# Patient Record
Sex: Female | Born: 1990 | Hispanic: No | Marital: Single | Smoking: Never smoker
Health system: Southern US, Community
[De-identification: ages and names within clinical notes are randomized; demographics above are authoritative.]

## PROBLEM LIST (undated history)

## (undated) ENCOUNTER — Inpatient Hospital Stay: Payer: Self-pay

## (undated) DIAGNOSIS — D649 Anemia, unspecified: Secondary | ICD-10-CM

## (undated) HISTORY — PX: WISDOM TOOTH EXTRACTION: SHX21

## (undated) HISTORY — PX: WRIST FRACTURE SURGERY: SHX121

---
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
## 2011-03-04 ENCOUNTER — Observation Stay: Payer: Self-pay

## 2011-03-04 LAB — URINALYSIS, COMPLETE
Bilirubin,UR: NEGATIVE
Glucose,UR: NEGATIVE mg/dL (ref 0–75)
Ketone: NEGATIVE
Protein: NEGATIVE
RBC,UR: 1 /HPF (ref 0–5)
Specific Gravity: 1.015 (ref 1.003–1.030)
Squamous Epithelial: 5

## 2011-03-04 LAB — FETAL FIBRONECTIN: Fetal Fibronectin: NEGATIVE

## 2011-03-05 ENCOUNTER — Ambulatory Visit: Payer: Self-pay

## 2011-04-12 ENCOUNTER — Observation Stay: Payer: Self-pay

## 2011-04-25 ENCOUNTER — Observation Stay: Payer: Self-pay

## 2011-04-27 ENCOUNTER — Observation Stay: Payer: Self-pay

## 2011-05-03 ENCOUNTER — Inpatient Hospital Stay: Payer: Self-pay | Admitting: Obstetrics and Gynecology

## 2011-05-03 LAB — CBC WITH DIFFERENTIAL/PLATELET
Basophil #: 0 10*3/uL (ref 0.0–0.1)
Basophil %: 0.4 %
Eosinophil #: 0.2 10*3/uL (ref 0.0–0.7)
HCT: 32.4 % — ABNORMAL LOW (ref 35.0–47.0)
HGB: 10.3 g/dL — ABNORMAL LOW (ref 12.0–16.0)
Lymphocyte #: 1.4 10*3/uL (ref 1.0–3.6)
Lymphocyte %: 18.8 %
MCH: 24.8 pg — ABNORMAL LOW (ref 26.0–34.0)
MCHC: 31.8 g/dL — ABNORMAL LOW (ref 32.0–36.0)
MCV: 78 fL — ABNORMAL LOW (ref 80–100)
Platelet: 164 10*3/uL (ref 150–440)
RDW: 15.7 % — ABNORMAL HIGH (ref 11.5–14.5)
WBC: 7.6 10*3/uL (ref 3.6–11.0)

## 2011-05-04 LAB — HEMATOCRIT: HCT: 29.1 % — ABNORMAL LOW (ref 35.0–47.0)

## 2011-10-23 ENCOUNTER — Emergency Department: Payer: Self-pay | Admitting: Unknown Physician Specialty

## 2012-08-07 ENCOUNTER — Emergency Department: Payer: Self-pay | Admitting: Emergency Medicine

## 2012-08-07 LAB — URINALYSIS, COMPLETE
Bilirubin,UR: NEGATIVE
Ph: 5 (ref 4.5–8.0)
Protein: NEGATIVE
Specific Gravity: 1.025 (ref 1.003–1.030)
Squamous Epithelial: 4
WBC UR: 11 /HPF (ref 0–5)

## 2012-08-07 LAB — COMPREHENSIVE METABOLIC PANEL
Albumin: 3.9 g/dL (ref 3.4–5.0)
Alkaline Phosphatase: 45 U/L — ABNORMAL LOW (ref 50–136)
Anion Gap: 3 — ABNORMAL LOW (ref 7–16)
Bilirubin,Total: 0.5 mg/dL (ref 0.2–1.0)
Calcium, Total: 8.9 mg/dL (ref 8.5–10.1)
Chloride: 107 mmol/L (ref 98–107)
Co2: 28 mmol/L (ref 21–32)
Creatinine: 0.71 mg/dL (ref 0.60–1.30)
Osmolality: 275 (ref 275–301)
Potassium: 3.4 mmol/L — ABNORMAL LOW (ref 3.5–5.1)
SGOT(AST): 17 U/L (ref 15–37)
Sodium: 138 mmol/L (ref 136–145)
Total Protein: 8 g/dL (ref 6.4–8.2)

## 2012-08-07 LAB — CBC
HCT: 36.8 % (ref 35.0–47.0)
HGB: 12.8 g/dL (ref 12.0–16.0)
MCHC: 34.9 g/dL (ref 32.0–36.0)
MCV: 87 fL (ref 80–100)
Platelet: 226 10*3/uL (ref 150–440)
RBC: 4.24 10*6/uL (ref 3.80–5.20)
RDW: 13.6 % (ref 11.5–14.5)
WBC: 7 10*3/uL (ref 3.6–11.0)

## 2012-08-07 LAB — WET PREP, GENITAL

## 2012-08-07 LAB — GC/CHLAMYDIA PROBE AMP

## 2013-12-04 ENCOUNTER — Emergency Department: Payer: Self-pay | Admitting: Emergency Medicine

## 2014-05-18 NOTE — H&P (Signed)
L&D Evaluation:  History Expanded:   HPI 24 yo G2P1001 with EDD of 04/26/11 per 14 week US. [facility] and mild anemia. Pt presents today at 32 3/7 weeks with c/o contractions. Denies SROM, VB or decreased FM.  Labs: A+, antibody neg, GC/CT neg    Blood Type A positive    Group B Strep Results (Result >5wks must be treated as unknown) unknown/result > 5 weeks ago    Maternal HIV Negative    Maternal Syphilis Ab Nonreactive    Maternal Varicella Immune    Rubella Results immune    Maternal T-Dap Unknown    Patient's Medical History No Chronic Illness    Patient's Surgical History hand surgery, wisdom teeth removal    Medications Pre Natal Vitamins  Iron    Allergies NKDA    Social History none   ROS:   ROS see HPI   Exam:   Vital Signs stable    General no apparent distress    Mental Status clear    Chest clear    Heart no murmur/gallop/rubs    Abdomen gravid, tender with contractions    Estimated Fetal Weight Average for gestational age    Back mild tenderness, would not consider + CVAT    Edema no edema    Clonus negative    Pelvic no external lesions, ft internal os, 1 cm external os/long/high, wet mount: negative    Mebranes Intact    FHT normal rate with no decels, Category I for most of tracing    Ucx regular, initially q 2-6 min on arrival    Skin dry    Other FFN negative UA: 2+ LE, 1 RBC, 1 WBC, trace bacteria, 5 epithelial   Impression:   Impression evaluate for PTL   Plan:   Comments Cervix unchanged on re-check Received dose of Procardia po without improvement Pt with syncopal episode with IV attempt, po hydration only given.  Macrobid po Betamethasone - 1st dose around 1800 today  Contractions decreased in regularity and intensity with rest and hydration - d/c home with instructions on pelvic rest, return tomorrow for 2nd BMZ, to office this week for follow-up.   Electronic  Signatures: Vella KohlerBrothers, Alexina Niccoli K (CNM)  (Signed 24-Feb-13 22:31)  Authored: L&D Evaluation   Last Updated: 24-Feb-13 22:31 by Vella KohlerBrothers, Mayana Irigoyen K (CNM)

## 2014-05-18 NOTE — H&P (Signed)
L&D Evaluation:  History:   HPI 24 yo G2P1001 with EDD of 04/26/11 per 14 week US. [facility] and mild anemia, and also preterm ctx's at 32 weeks which she rec'd tocolytics and BMTZ x2. Pt presents today at 38 weeks with c/o "gush" of fluid a few hours ago. Pt states it was clear and "mucousy" in color. Labs: A+, RI, VI, GC/CT neg, GBS Negative    Presents with leaking fluid    Patient's Medical History No Chronic Illness    Patient's Surgical History hand surgery, wisdom teeth removal    Medications Pre Natal Vitamins  Iron    Allergies NKDA    Social History none   ROS:   ROS see HPI   Exam:   Vital Signs stable    General no apparent distress    Mental Status clear    Chest clear    Abdomen gravid, non-tender, gravid, tender with contractions    Estimated Fetal Weight Average for gestational age    Edema no edema    Clonus negative    Pelvic no external lesions, 2-3 per RN exam    Mebranes Intact    FHT normal rate with no decels    Ucx irregular    Skin dry    Other Nitrazine negative, fern negative   Impression:   Impression reactive NST, 38 weeks IUP, no rupture of membranes   Plan:   Plan EFM/NST, discharge    Comments Nitrazine and fern negative, pt states did have intercourse today, informed that likely urine or semen that she felt. Pt has follow up either tomorrow or Monday, will check. DC and follow up as sched.    Follow Up Appointment already scheduled   Electronic Signatures: Shella Maximutnam, Dex Blakely (CNM)  (Signed 04-Apr-13 21:35)  Authored: L&D Evaluation   Last Updated: 04-Apr-13 21:35 by Shella MaximPutnam, Coti Burd (CNM)

## 2014-05-18 NOTE — H&P (Signed)
L&D Evaluation:  History:   HPI 24 year old G2P1 at 1841 weeks presents to L&D with c/o contractions. Was scheduled for post date incuction this evening. EDD 04/26/11, [facility] and anemia. No problems during pregnancy. Ob hx of 1 previous SVD in 2011 with no complications. Labs: A Pos, RI, VI, RPR NR, Hep B and HIV Negative,  GC/Chlam and GBS Negative    Presents with contractions    Patient's Medical History No Chronic Illness  anemia during pregnancy    Patient's Surgical History other  wisdom teeth, hand surgery    Medications Pre Natal Vitamins    Allergies NKDA    Social History none    Family History Non-Contributory   ROS:   ROS All systems were reviewed.  HEENT, CNS, GI, GU, Respiratory, CV, Renal and Musculoskeletal systems were found to be normal.   Exam:   Vital Signs stable    Urine Protein not completed    General no apparent distress, breathing through ctx's    Mental Status clear    Abdomen gravid, non-tender    Estimated Fetal Weight Average for gestational age    Back no CVAT    Edema no edema    Pelvic no external lesions, 7-8 cm on arrival, bulging membranes per RN    Mebranes Intact    FHT normal rate with no decels    Ucx regular   Impression:   Impression active labor, reactive NST, 41 weeks, active labor   Plan:   Plan EFM/NST, IV, epidural when desires, anticipate SVD   Electronic Signatures: Shella MaximPutnam, Linea Calles (CNM)  (Signed 25-Apr-13 10:43)  Authored: L&D Evaluation   Last Updated: 25-Apr-13 10:43 by Shella MaximPutnam, Doyne Micke (CNM)

## 2014-08-28 ENCOUNTER — Encounter: Payer: Self-pay | Admitting: *Deleted

## 2014-08-28 ENCOUNTER — Observation Stay
Admission: EM | Admit: 2014-08-28 | Discharge: 2014-08-28 | Disposition: A | Discharge status: Home / Self Care | Payer: Medicaid Other | Attending: Obstetrics and Gynecology | Admitting: Obstetrics and Gynecology

## 2014-08-28 DIAGNOSIS — Z3A39 39 weeks gestation of pregnancy: Secondary | ICD-10-CM | POA: Diagnosis not present

## 2014-08-28 DIAGNOSIS — Z3490 Encounter for supervision of normal pregnancy, unspecified, unspecified trimester: Secondary | ICD-10-CM

## 2014-08-28 HISTORY — DX: Anemia, unspecified: D64.9

## 2014-08-28 NOTE — H&P (Signed)
Obstetric H&P   Chief Complaint: Pressure  Prenatal Care Provider: other  History of Present Illness: 24 y.o. [id number] at [redacted]w[redacted]d by [facility] of 09/04/2014 presenting with pelvic pressure.  Irregular contraction.  No LOF, no VB.  PNC thus far uncomplicated per the patient, she is visiting her mother from out of town.   Review of Systems: 10 point review of systems negative unless otherwise noted in HPI  Past Medical History: None  Family History: No family history on file.  Social History: Social History   Social History  . Marital Status: Single    Spouse Name: N/A  . Number of Children: N/A  . Years of Education: N/A   Occupational History  . Not on file.   Social History Main Topics  . Smoking status: Not on file  . Smokeless tobacco: Not on file  . Alcohol Use: Not on file  . Drug Use: Not on file  . Sexual Activity: Not on file   Other Topics Concern  . Not on file   Social History Narrative  . No narrative on file    Medications: Prior to Admission medications   Not on File    Allergies: Allergies not on file  Physical Exam: Filed Vitals:   08/28/14 1723  Temp: 98.2 F (36.8 C)  TempSrc: Oral  Resp: 20  Height:  (1.651 m)  Weight: 55.339 kg (122 lb)    FHT: 140, moderate variability, +accels, no decels Toco: irregular q70min  General: NAD HEENT: normocephalic, anicteric Pulmonary: no increased work of breathing Abdomen: Gravid,  Non-tender Leopolds: vtx Genitourinary: 1/50/-3 Extremities: no edema  Labs: No results found for this or any previous visit (from the past 24 hour(s)).  Assessment: 24 y.o. [id number] with discomforts of pregnancy Plan: 1) R/O labor - no evidence of labor, routine labor precautions  2) Fetus - category I tracing  3) Disposition - discharge home has follow up in place  09/01/14 with her regular OB provider

## 2014-08-28 NOTE — Discharge Summary (Signed)
Reviewed discharge instructions with patient and family including signs of labor and reasons to return to ER. Both verbalized understanding. Copy of instructions given to patient.

## 2014-09-03 ENCOUNTER — Inpatient Hospital Stay
Admission: EM | Admit: 2014-09-03 | Discharge: 2014-09-03 | Disposition: A | Discharge status: Home / Self Care | Payer: Medicaid Other | Attending: Obstetrics and Gynecology | Admitting: Obstetrics and Gynecology

## 2014-09-03 DIAGNOSIS — Z3493 Encounter for supervision of normal pregnancy, unspecified, third trimester: Secondary | ICD-10-CM | POA: Diagnosis not present

## 2014-09-03 DIAGNOSIS — Z3A40 40 weeks gestation of pregnancy: Secondary | ICD-10-CM | POA: Insufficient documentation

## 2014-09-03 NOTE — Discharge Instructions (Signed)
Patient to return for regular contractions, ruptured membranes or any other concerns.

## 2014-12-14 ENCOUNTER — Emergency Department (HOSPITAL_COMMUNITY): Payer: Medicaid Other

## 2014-12-14 ENCOUNTER — Encounter (HOSPITAL_COMMUNITY): Payer: Self-pay | Admitting: *Deleted

## 2014-12-14 ENCOUNTER — Emergency Department (HOSPITAL_COMMUNITY)
Admission: EM | Admit: 2014-12-14 | Discharge: 2014-12-14 | Disposition: A | Discharge status: Home / Self Care | Payer: Medicaid Other | Attending: Emergency Medicine | Admitting: Emergency Medicine

## 2014-12-14 DIAGNOSIS — R200 Anesthesia of skin: Secondary | ICD-10-CM | POA: Insufficient documentation

## 2014-12-14 DIAGNOSIS — R51 Headache: Secondary | ICD-10-CM | POA: Insufficient documentation

## 2014-12-14 DIAGNOSIS — Z79899 Other long term (current) drug therapy: Secondary | ICD-10-CM | POA: Insufficient documentation

## 2014-12-14 DIAGNOSIS — D649 Anemia, unspecified: Secondary | ICD-10-CM | POA: Insufficient documentation

## 2014-12-14 DIAGNOSIS — R42 Dizziness and giddiness: Secondary | ICD-10-CM | POA: Insufficient documentation

## 2014-12-14 DIAGNOSIS — R110 Nausea: Secondary | ICD-10-CM | POA: Insufficient documentation

## 2014-12-14 DIAGNOSIS — R519 Headache, unspecified: Secondary | ICD-10-CM

## 2014-12-14 DIAGNOSIS — R61 Generalized hyperhidrosis: Secondary | ICD-10-CM | POA: Insufficient documentation

## 2014-12-14 DIAGNOSIS — R079 Chest pain, unspecified: Secondary | ICD-10-CM

## 2014-12-14 LAB — BASIC METABOLIC PANEL
ANION GAP: 9 (ref 5–15)
BUN: 14 mg/dL (ref 6–20)
CHLORIDE: 103 mmol/L (ref 101–111)
CO2: 27 mmol/L (ref 22–32)
Calcium: 9.1 mg/dL (ref 8.9–10.3)
Creatinine, Ser: 0.52 mg/dL (ref 0.44–1.00)
GFR calc Af Amer: >60 mL/min (ref >60)
GLUCOSE: 95 mg/dL (ref 65–99)
POTASSIUM: 3.8 mmol/L (ref 3.5–5.1)
Sodium: 139 mmol/L (ref 135–145)

## 2014-12-14 LAB — CBC
HEMATOCRIT: 35.5 % — AB (ref 36.0–46.0)
HEMOGLOBIN: 11.5 g/dL — AB (ref 12.0–15.0)
MCH: 27.5 pg (ref 26.0–34.0)
MCHC: 32.4 g/dL (ref 30.0–36.0)
MCV: 84.9 fL (ref 78.0–100.0)
Platelets: 266 10*3/uL (ref 150–400)
RBC: 4.18 MIL/uL (ref 3.87–5.11)
RDW: 16.1 % — ABNORMAL HIGH (ref 11.5–15.5)
WBC: 6.3 10*3/uL (ref 4.0–10.5)

## 2014-12-14 LAB — I-STAT TROPONIN, ED
Troponin i, poc: 0 ng/mL (ref 0.00–0.08)
Troponin i, poc: 0.01 ng/mL (ref 0.00–0.08)

## 2014-12-14 MED ORDER — FLUCONAZOLE 100 MG PO TABS
150.0000 mg | ORAL_TABLET | Freq: Once | ORAL | Status: AC
  Administered 2014-12-14: 150 mg via ORAL
  Filled 2014-12-14: qty 2

## 2014-12-14 MED ORDER — NAPROXEN 375 MG PO TABS: 375.0000 mg | ORAL_TABLET | Freq: Two times a day (BID) | ORAL | Status: AC

## 2014-12-14 MED ORDER — PROCHLORPERAZINE EDISYLATE 5 MG/ML IJ SOLN
10.0000 mg | Freq: Once | INTRAMUSCULAR | Status: DC
  Filled 2014-12-14: qty 2

## 2014-12-14 MED ORDER — KETOROLAC TROMETHAMINE 30 MG/ML IJ SOLN
30.0000 mg | Freq: Once | INTRAMUSCULAR | Status: AC
  Administered 2014-12-14: 30 mg via INTRAVENOUS
  Filled 2014-12-14: qty 1

## 2014-12-14 MED ORDER — DIPHENHYDRAMINE HCL 50 MG/ML IJ SOLN
25.0000 mg | Freq: Once | INTRAMUSCULAR | Status: DC
  Filled 2014-12-14: qty 1

## 2014-12-14 MED ORDER — DEXAMETHASONE SODIUM PHOSPHATE 10 MG/ML IJ SOLN
10.0000 mg | Freq: Once | INTRAMUSCULAR | Status: AC
  Administered 2014-12-14: 10 mg via INTRAVENOUS
  Filled 2014-12-14: qty 1

## 2014-12-14 MED ORDER — SODIUM CHLORIDE 0.9 % IV BOLUS (SEPSIS)
1000.0000 mL | Freq: Once | INTRAVENOUS | Status: AC
  Administered 2014-12-14: 1000 mL via INTRAVENOUS

## 2014-12-14 NOTE — ED Notes (Signed)
Pt reports Migraine onset today about an hr ago, pt reports L sided CP onset x 4 days ago, pt C/O SOB, pt c/o nausea, denies v/d, pt c/o blurred vision & sensitivity, A&O x4, no slurred speech noted, pt ambulatory

## 2014-12-14 NOTE — ED Notes (Signed)
PT refuses an IV at this time. It is explained to PT that IV medications are fast acting and are standard treatment for severe headaches. PT rates pain 6/10 at this time and reports she took 650mg  of tylenol at 1230 with no relief. PT confirms she does wish to refuse IV.

## 2014-12-14 NOTE — ED Provider Notes (Signed)
CSN: [id number]     Arrival date & time 12/14/14  1346 History  By signing my name below, I, Tanda Rockers, attest that this documentation has been prepared under the direction and in the presence of Haley Captain, PA-C.  Electronically Signed: Tanda Rockers, ED Scribe. 12/14/2014. 4:20 PM.   Chief Complaint  Patient presents with  . Headache   The history is provided by the patient. No language interpreter was used.     HPI Comments: Haley Carter is a 24 y.o. female who presents to the Emergency Department complaining of sudden onset, constant, left frontal headache that began at 12:30 PM today (approximately 3.5 hours ago). Pt reports that she was at work when she stood up and began feeling dizzy, nauseous, hot, and sweaty. Immediately after feeling dizzy her headache came on. Pt does describe her headache as "the worse headache of my life." She mentions having intermittent mild headaches for the past week, but none were this severe. Pt also states that the left side of her face feels numb as well. She does not have any hx of migraine headaches. Pt also complains of intermittent chest pain x 3 days, constant since her headache came on. She describes the chest pain as a tightness in her chest. No hx chest problems in the past or anxiety. Pt denies palpitations, cough, shortness of breath, vomiting, visual changes, or any other associated symptoms. Pt is non smoker. Not currently on any hormonal birth control. No hx DVT/PE. No immediate FHx of cardiac issues. She does report that her first cousin passed away from an MI at the age of 42.    Past Medical History  Diagnosis Date  . Anemia    Past Surgical History  Procedure Laterality Date  . Wrist fracture surgery Left   . Wisdom tooth extraction     No family history on file. Social History  Substance Use Topics  . Smoking status: Never Smoker   . Smokeless tobacco: None  . Alcohol Use: No   OB History    Gravida Para Term  Preterm AB TAB SAB Ectopic Multiple Living   Review of Systems  Constitutional: Positive for diaphoresis.  Eyes: Negative for visual disturbance.  Respiratory: Negative for cough and shortness of breath.   Cardiovascular: Positive for chest pain. Negative for palpitations and leg swelling.  Gastrointestinal: Positive for nausea. Negative for vomiting.  Neurological: Positive for dizziness, numbness and headaches.  All other systems reviewed and are negative.  Allergies  Review of patient's allergies indicates no known allergies.  Home Medications   Prior to Admission medications   Medication Sig Start Date End Date Taking? Authorizing Provider  Prenatal Vit-Fe Fumarate-FA (PRENATAL MULTIVITAMIN) TABS tablet Take 1 tablet by mouth daily at 12 noon.    Historical Provider, MD   Triage Vitals: BP 102/72 mmHg  Pulse 79  Temp(Src) 98.3 F (36.8 C) (Oral)  Resp 20  SpO2 98%  LMP 11/22/2014  Breastfeeding? Unknown   Physical Exam  Constitutional: She is oriented to person, place, and time. She appears well-developed and well-nourished. No distress.  HENT:  Head: Normocephalic and atraumatic.  Eyes: Conjunctivae and EOM are normal.  Neck: Neck supple. No tracheal deviation present.  Cardiovascular: Normal rate.   Pulmonary/Chest: Effort normal. No respiratory distress.  Musculoskeletal: Normal range of motion.  Neurological: She is alert and oriented to person, place, and time.  Skin: Skin is  warm and dry.  Psychiatric: She has a normal mood and affect. Her behavior is normal.  Nursing note and vitals reviewed.   ED Course  Procedures (including critical care time)  DIAGNOSTIC STUDIES: Oxygen Saturation is 98% on RA, normal by my interpretation.    COORDINATION OF CARE: 4:20 PM-Discussed treatment plan which includes CT Head with pt at bedside and pt agreed to plan.   Labs Review Labs Reviewed  CBC - Abnormal; Notable for the following:     Hemoglobin 11.5 (*)    HCT 35.5 (*)    RDW 16.1 (*)    All other components within normal limits  BASIC METABOLIC PANEL  I-STAT TROPOININ, ED    Imaging Review Dg Chest 2 View  12/14/2014  CLINICAL DATA:  Migraine headaches beginning 1 hour ago. Left-sided chest pain. Shortness breath. EXAM: CHEST - 2 VIEW COMPARISON:  None. FINDINGS: The heart size and mediastinal contours are within normal limits. Both lungs are clear. The visualized skeletal structures are unremarkable. IMPRESSION: No active disease. Electronically Signed   By: Marin Robertshristopher  Mattern M.D.   On: 12/14/2014 14:53   Ct Head Wo Contrast  12/14/2014  CLINICAL DATA:  Intense headache with light sensitivity beginning today. EXAM: CT HEAD WITHOUT CONTRAST TECHNIQUE: Contiguous axial images were obtained from the base of the skull through the vertex without intravenous contrast. COMPARISON:  None. FINDINGS: No acute cortical infarct, hemorrhage, or mass lesion is present. The ventricles are of normal size. No significant extra-axial fluid collection is evident. The paranasal sinuses and mastoid air cells are clear. The calvarium is intact. The globes and orbits are within normal limits. No focal extracranial soft tissue lesions are present. IMPRESSION: Negative CT of the head. Electronically Signed   By: Marin Robertshristopher  Mattern M.D.   On: 12/14/2014 17:58   I have personally reviewed and evaluated these images and lab results as part of my medical decision-making.   EKG Interpretation None      MDM   Final diagnoses:  Bad headache  Chest pain, unspecified chest pain type   Negative CT. Pt HA treated and improved while in ED.  Presentation is like pts typical HA and non concerning for [facility] System[facility], ICH, Meningitis, or temporal arteritis. Pt is afebrile with no focal neuro deficits, nuchal rigidity, or change in vision. Pt is to follow up with PCP to discuss prophylactic medication. Pt verbalizes understanding and is agreeable with plan to  dc.   I personally performed the services described in this documentation, which was scribed in my presence. The recorded information has been reviewed and is accurate.         Haley Captainbigail Novalyn Lajara, PA-C 12/17/14 1616  Gwyneth SproutWhitney Plunkett, MD 12/18/14 1538

## 2014-12-14 NOTE — Discharge Instructions (Signed)
You are having a headache. No specific cause was found today for your headache. It may have been a migraine or other cause of headache. Stress, anxiety, fatigue, and depression are common triggers for headaches. Your headache today does not appear to be life-threatening or require hospitalization, but often the exact cause of headaches is not determined in the emergency department. Therefore, follow-up with your doctor is very important to find out what may have caused your headache, and whether or not you need any further diagnostic testing or treatment. Sometimes headaches can appear benign (not harmful), but then more serious symptoms can develop which should prompt an immediate re-evaluation by your doctor or the emergency department. SEEK MEDICAL ATTENTION IF: You develop possible problems with medications prescribed.  The medications don't resolve your headache, if it recurs , or if you have multiple episodes of vomiting or can't take fluids. You have a change from the usual headache. RETURN IMMEDIATELY IF you develop a sudden, severe headache or confusion, become poorly responsive or faint, develop a fever above 100.71F or problem breathing, have a change in speech, vision, swallowing, or understanding, or develop new weakness, numbness, tingling, incoordination, or have a seizure.  Your caregiver has diagnosed you as having chest pain that is not specific for one problem, but does not require admission.  You are at low risk for an acute heart condition or other serious illness. Chest pain comes from many different causes.  SEEK IMMEDIATE MEDICAL ATTENTION IF: You have severe chest pain, especially if the pain is crushing or pressure-like and spreads to the arms, back, neck, or jaw, or if you have sweating, nausea (feeling sick to your stomach), or shortness of breath. THIS IS AN EMERGENCY. Don't wait to see if the pain will go away. Get medical help at once. Call 911 or 0 (operator). DO NOT drive  yourself to the hospital.  Your chest pain gets worse and does not go away with rest.  You have an attack of chest pain lasting longer than usual, despite rest and treatment with the medications your caregiver has prescribed.  You wake from sleep with chest pain or shortness of breath.  You feel dizzy or faint.  You have chest pain not typical of your usual pain for which you originally saw your caregiver.  General Headache Without Cause A headache is pain or discomfort felt around the head or neck area. The specific cause of a headache may not be found. There are many causes and types of headaches. A few common ones are:  Tension headaches.  Migraine headaches.  Cluster headaches.  Chronic daily headaches. HOME CARE INSTRUCTIONS  Watch your condition for any changes. Take these steps to help with your condition: Managing Pain  Take over-the-counter and prescription medicines only as told by your health care provider.  Lie down in a dark, quiet room when you have a headache.  If directed, apply ice to the head and neck area:  Put ice in a plastic bag.  Place a towel between your skin and the bag.  Leave the ice on for 20 minutes, 2-3 times per day.  Use a heating pad or hot shower to apply heat to the head and neck area as told by your health care provider.  Keep lights dim if bright lights bother you or make your headaches worse. Eating and Drinking  Eat meals on a regular schedule.  Limit alcohol use.  Decrease the amount of caffeine you drink, or stop drinking caffeine. General  Instructions  Keep all follow-up visits as told by your health care provider. This is important.  Keep a headache journal to help find out what may trigger your headaches. For example, write down:  What you eat and drink.  How much sleep you get.  Any change to your diet or medicines.  Try massage or other relaxation techniques.  Limit stress.  Sit up straight, and do not tense  your muscles.  Do not use tobacco products, including cigarettes, chewing tobacco, or e-cigarettes. If you need help quitting, ask your health care provider.  Exercise regularly as told by your health care provider.  Sleep on a regular schedule. Get 7-9 hours of sleep, or the amount recommended by your health care provider. SEEK MEDICAL CARE IF:   Your symptoms are not helped by medicine.  You have a headache that is different from the usual headache.  You have nausea or you vomit.  You have a fever. SEEK IMMEDIATE MEDICAL CARE IF:   Your headache becomes severe.  You have repeated vomiting.  You have a stiff neck.  You have a loss of vision.  You have problems with speech.  You have pain in the eye or ear.  You have muscular weakness or loss of muscle control.  You lose your balance or have trouble walking.  You feel faint or pass out.  You have confusion.   This information is not intended to replace advice given to you by your health care provider. Make sure you discuss any questions you have with your health care provider.   Document Released: 12/25/2004 Document Revised: 09/15/2014 Document Reviewed: 04/19/2014 Elsevier Interactive Patient Education 2016 [employer]  Nonspecific Chest Pain  Chest pain can be caused by many different conditions. There is always a chance that your pain could be related to something serious, such as a heart attack or a blood clot in your lungs. Chest pain can also be caused by conditions that are not life-threatening. If you have chest pain, it is very important to follow up with your health care provider. CAUSES  Chest pain can be caused by:  Heartburn.  Pneumonia or bronchitis.  Anxiety or stress.  Inflammation around your heart (pericarditis) or lung (pleuritis or pleurisy).  A blood clot in your lung.  A collapsed lung (pneumothorax). It can develop suddenly on its own (spontaneous pneumothorax) or from trauma to the  chest.  Shingles infection (varicella-zoster virus).  Heart attack.  Damage to the bones, muscles, and cartilage that make up your chest wall. This can include:  Bruised bones due to injury.  Strained muscles or cartilage due to frequent or repeated coughing or overwork.  Fracture to one or more ribs.  Sore cartilage due to inflammation (costochondritis). RISK FACTORS  Risk factors for chest pain may include:  Activities that increase your risk for trauma or injury to your chest.  Respiratory infections or conditions that cause frequent coughing.  Medical conditions or overeating that can cause heartburn.  Heart disease or family history of heart disease.  Conditions or health behaviors that increase your risk of developing a blood clot.  Having had chicken pox (varicella zoster). SIGNS AND SYMPTOMS Chest pain can feel like:  Burning or tingling on the surface of your chest or deep in your chest.  Crushing, pressure, aching, or squeezing pain.  Dull or sharp pain that is worse when you move, cough, or take a deep breath.  Pain that is also felt in your back, neck, shoulder,  or arm, or pain that spreads to any of these areas. Your chest pain may come and go, or it may stay constant. DIAGNOSIS Lab tests or other studies may be needed to find the cause of your pain. Your health care provider may have you take a test called an ambulatory ECG (electrocardiogram). An ECG records your heartbeat patterns at the time the test is performed. You may also have other tests, such as:  Transthoracic echocardiogram (TTE). During echocardiography, sound waves are used to create a picture of all of the heart structures and to look at how blood flows through your heart.  Transesophageal echocardiogram (TEE).This is a more advanced imaging test that obtains images from inside your body. It allows your health care provider to see your heart in finer detail.  Cardiac monitoring. This allows  your health care provider to monitor your heart rate and rhythm in real time.  Holter monitor. This is a portable device that records your heartbeat and can help to diagnose abnormal heartbeats. It allows your health care provider to track your heart activity for several days, if needed.  Stress tests. These can be done through exercise or by taking medicine that makes your heart beat more quickly.  Blood tests.  Imaging tests. TREATMENT  Your treatment depends on what is causing your chest pain. Treatment may include:  Medicines. These may include:  Acid blockers for heartburn.  Anti-inflammatory medicine.  Pain medicine for inflammatory conditions.  Antibiotic medicine, if an infection is present.  Medicines to dissolve blood clots.  Medicines to treat coronary artery disease.  Supportive care for conditions that do not require medicines. This may include:  Resting.  Applying heat or cold packs to injured areas.  Limiting activities until pain decreases. HOME CARE INSTRUCTIONS  If you were prescribed an antibiotic medicine, finish it all even if you start to feel better.  Avoid any activities that bring on chest pain.  Do not use any tobacco products, including cigarettes, chewing tobacco, or electronic cigarettes. If you need help quitting, ask your health care provider.  Do not drink alcohol.  Take medicines only as directed by your health care provider.  Keep all follow-up visits as directed by your health care provider. This is important. This includes any further testing if your chest pain does not go away.  If heartburn is the cause for your chest pain, you may be told to keep your head raised (elevated) while sleeping. This reduces the chance that acid will go from your stomach into your esophagus.  Make lifestyle changes as directed by your health care provider. These may include:  Getting regular exercise. Ask your health care provider to suggest some  activities that are safe for you.  Eating a heart-healthy diet. A registered dietitian can help you to learn healthy eating options.  Maintaining a healthy weight.  Managing diabetes, if necessary.  Reducing stress. SEEK MEDICAL CARE IF:  Your chest pain does not go away after treatment.  You have a rash with blisters on your chest.  You have a fever. SEEK IMMEDIATE MEDICAL CARE IF:   Your chest pain is worse.  You have an increasing cough, or you cough up blood.  You have severe abdominal pain.  You have severe weakness.  You faint.  You have chills.  You have sudden, unexplained chest discomfort.  You have sudden, unexplained discomfort in your arms, back, neck, or jaw.  You have shortness of breath at any time.  You suddenly start  to sweat, or your skin gets clammy.  You feel nauseous or you vomit.  You suddenly feel light-headed or dizzy.  Your heart begins to beat quickly, or it feels like it is skipping beats. These symptoms may represent a serious problem that is an emergency. Do not wait to see if the symptoms will go away. Get medical help right away. Call your local emergency services (911 in the [place] Do not drive yourself to the hospital.   This information is not intended to replace advice given to you by your health care provider. Make sure you discuss any questions you have with your health care provider.   Document Released: 10/04/2004 Document Revised: 01/15/2014 Document Reviewed: 07/31/2013 Elsevier Interactive Patient Education [employer].

## 2018-01-31 ENCOUNTER — Other Ambulatory Visit: Payer: Self-pay | Admitting: Nurse Practitioner

## 2018-01-31 DIAGNOSIS — S39012D Strain of muscle, fascia and tendon of lower back, subsequent encounter: Secondary | ICD-10-CM

## 2018-02-11 ENCOUNTER — Ambulatory Visit
Admission: RE | Admit: 2018-02-11 | Discharge: 2018-02-11 | Disposition: A | Discharge status: Home / Self Care | Payer: Worker's Compensation | Source: Ambulatory Visit | Attending: Nurse Practitioner | Admitting: Nurse Practitioner

## 2018-02-11 DIAGNOSIS — S39012D Strain of muscle, fascia and tendon of lower back, subsequent encounter: Secondary | ICD-10-CM

## 2020-12-03 IMAGING — MR MR LUMBAR SPINE W/O CM
4 of 5 series · 19 of 48 positions shown · non-contrast
Comparison: None available.

CLINICAL DATA: Initial evaluation for low back pain with left lower
extremity/posterior thigh pain.

EXAM:
MRI LUMBAR SPINE WITHOUT CONTRAST
TECHNIQUE: Multiplanar, multisequence MR imaging of the lumbar spine was
performed. No intravenous contrast was administered.

[Series 5: T2 · sagittal · 4.0mm · 0.68mm/px · 6 of 12 slices shown (1 of 2)]
[im 1/12]
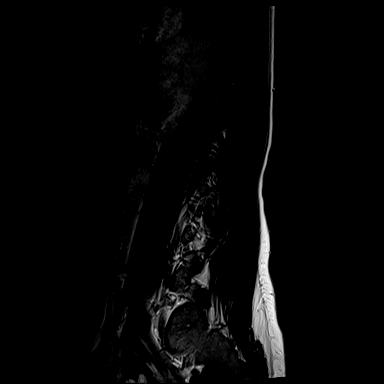
[im 3/12]
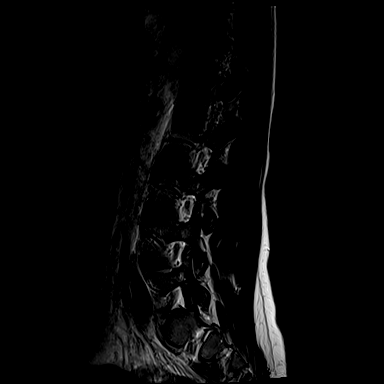
[im 5/12]
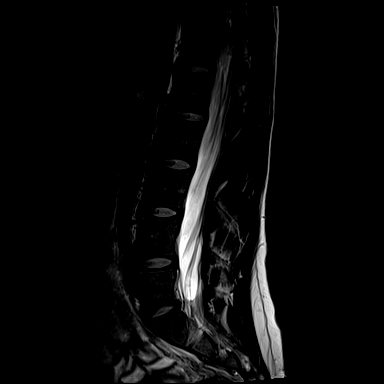
[im 7/12]
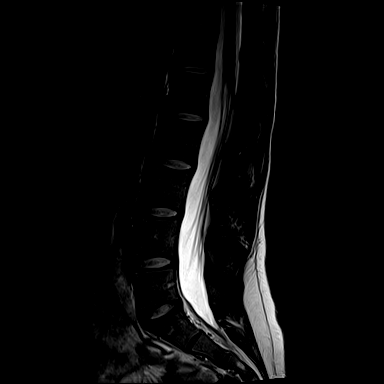
[im 9/12]
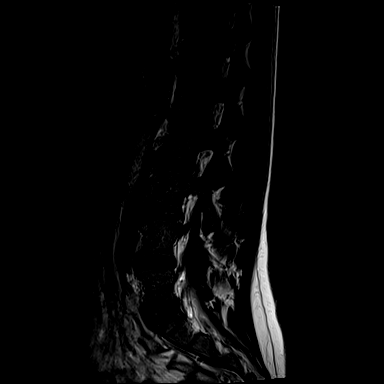
[im 12/12]
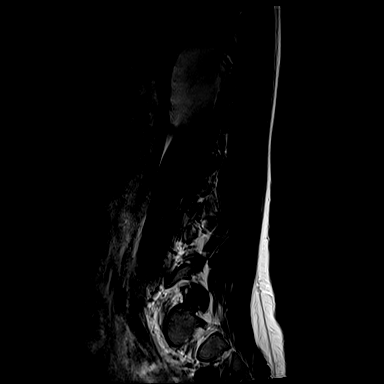

[Series 6: T1 · sagittal · 4.0mm · 0.81mm/px · 3 of 12 slices shown (1 of 2)]
[im 1/12]
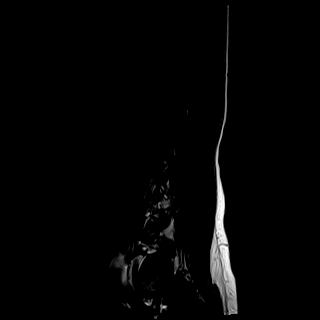
[im 6/12]
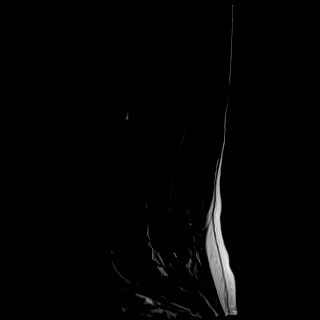
[im 12/12]
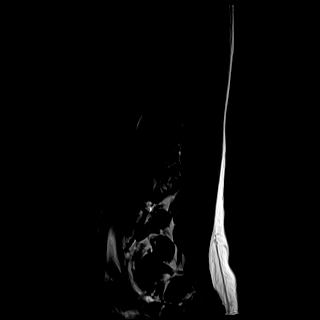

[Series 10: T1 · axial · 4.0mm · 0.28mm/px · z∈[-81,+77]mm · 3 of 36 slices shown (2 of 2)]
[im 5/36]
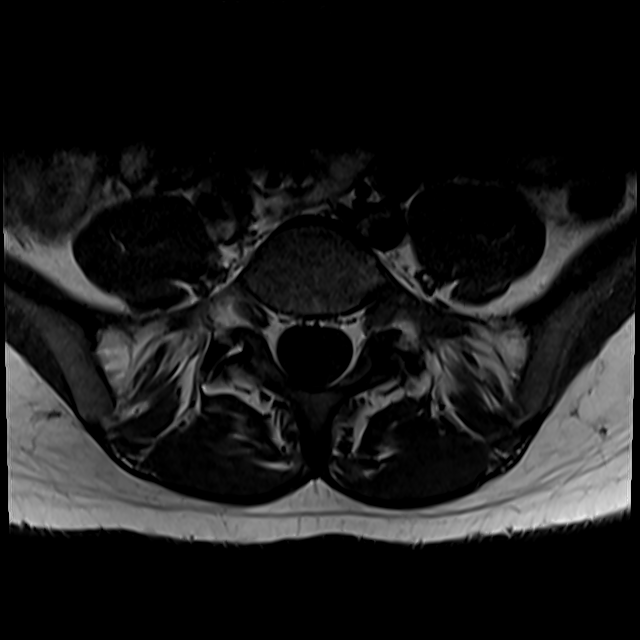
[im 19/36]
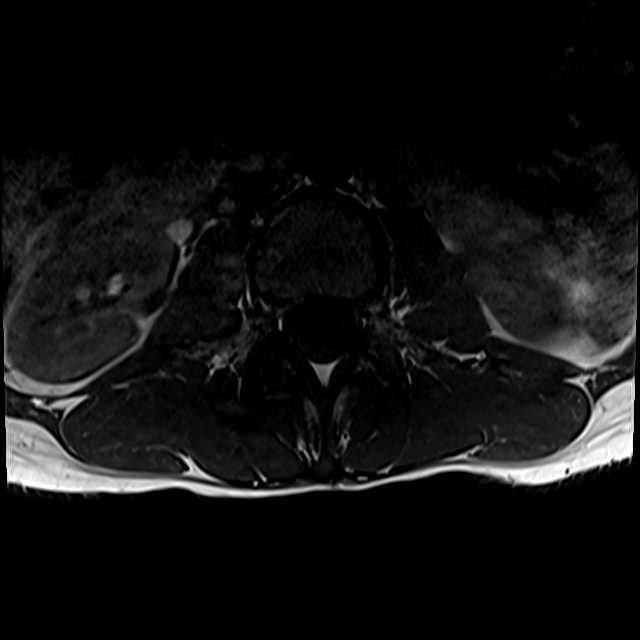
[im 31/36]
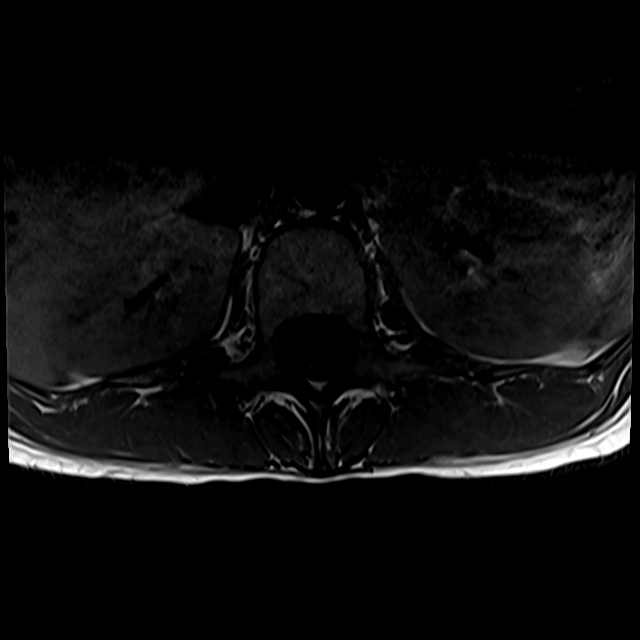

[Series 13: T2 · axial · 4.0mm · 0.28mm/px · z∈[-91,+77]mm · 7 of 36 slices shown (2 of 2)]
[im 3/36]
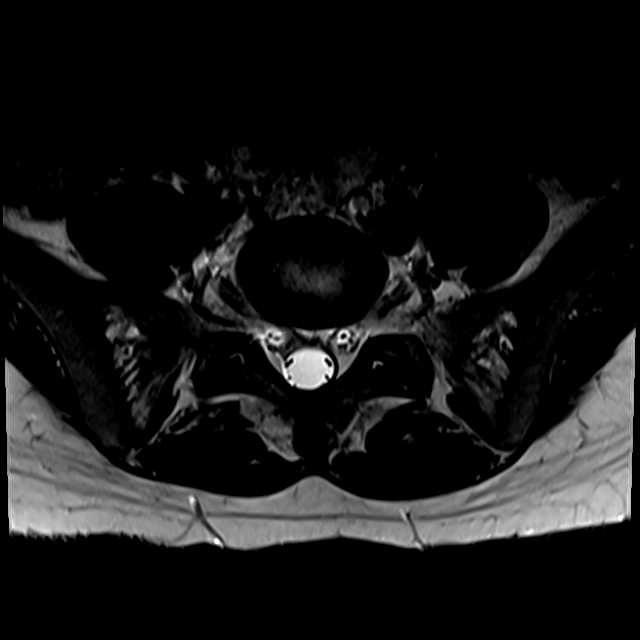
[im 5/36]
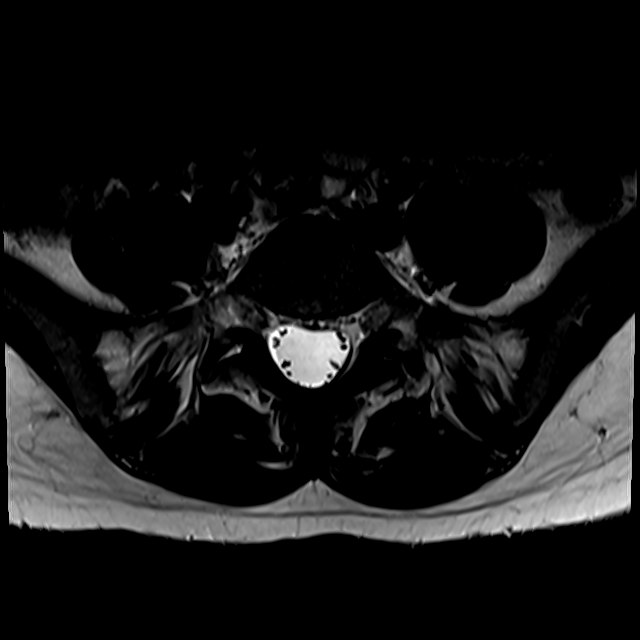
[im 8/36]
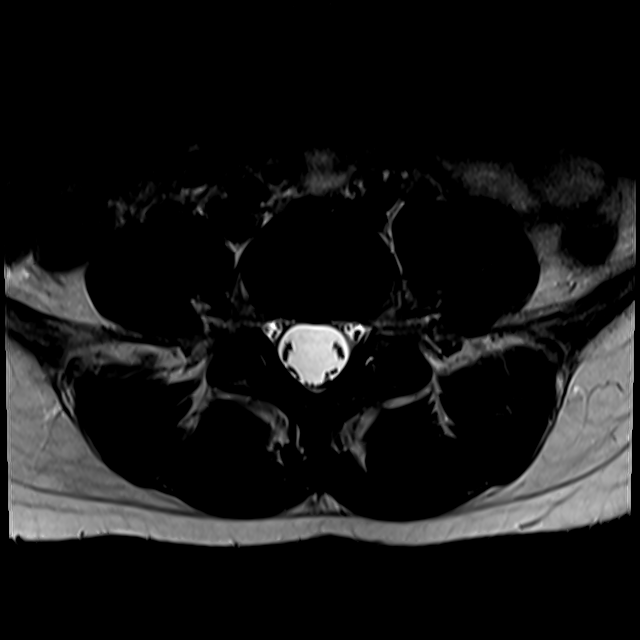
[im 12/36]
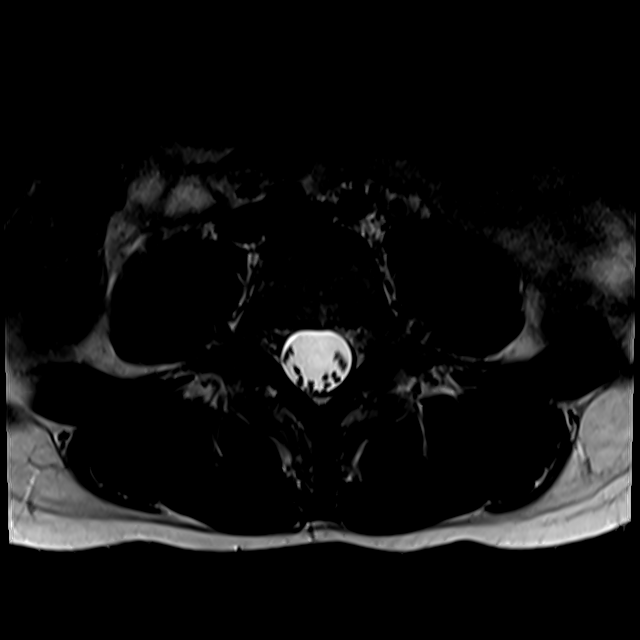
[im 17/36]
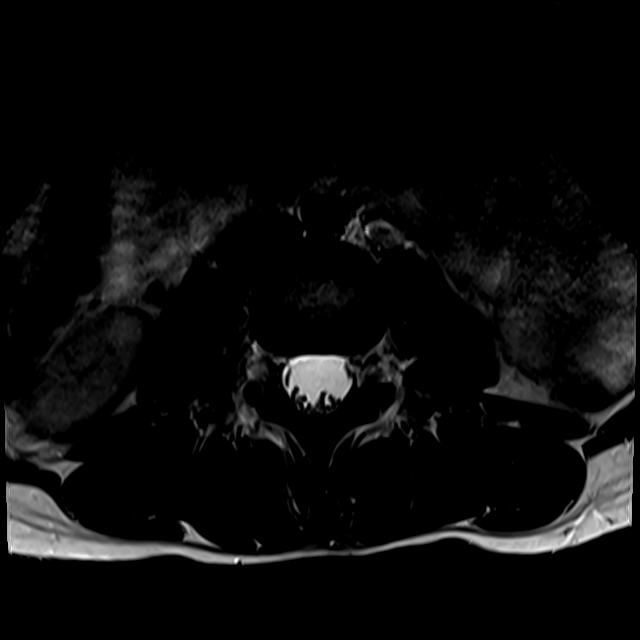
[im 19/36]
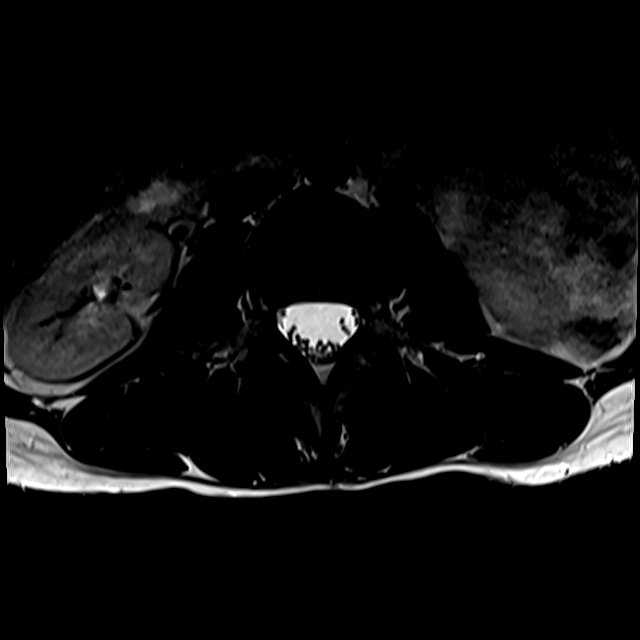
[im 31/36]
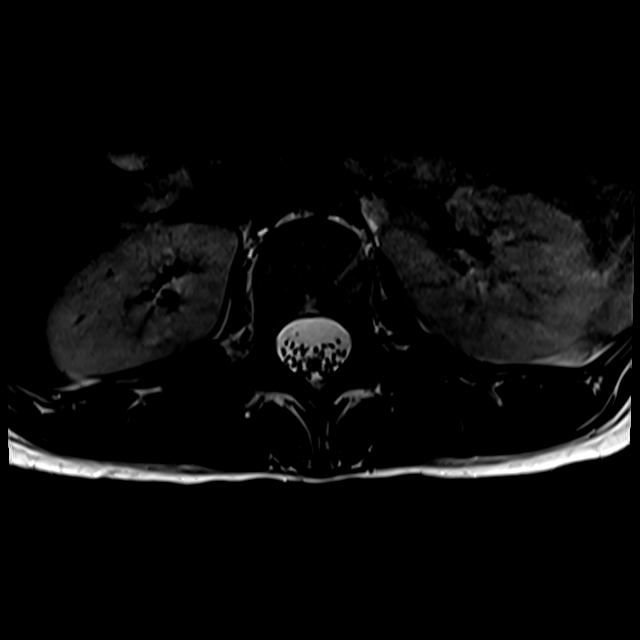

[19 of 48 positions shown; findings below may reference images not displayed]

FINDINGS: Segmentation: Standard. Lowest well-formed disc labeled the L5-S1
level.

Alignment: Physiologic with preservation of the normal lumbar
lordosis. No listhesis.

Vertebrae: Vertebral body heights well maintained without evidence
for acute or chronic fracture. Bone marrow signal intensity within
normal limits. No discrete or worrisome osseous lesions. No abnormal
marrow edema.

Conus medullaris and cauda equina: Conus extends to the L1 level.
Conus and cauda equina appear normal.

Paraspinal and other soft tissues: Paraspinous soft tissues within
normal limits. Visualized visceral structures unremarkable.

Disc levels:

No significant disc pathology seen within the lumbar spine.
Intervertebral discs are well hydrated with preserved disc height.
No significant facet pathology. No canal or neural foraminal
stenosis. No neural impingement.
IMPRESSION: Normal MRI of the lumbar spine. No significant disc pathology,
stenosis, or evidence for neural impingement. No findings to explain
patient's symptoms identified.
